# Patient Record
Sex: Male | Born: 1980 | Race: White | Hispanic: No | Marital: Married | Smoking: Current every day smoker
Health system: Southern US, Community
[De-identification: ages and names within clinical notes are randomized; demographics above are authoritative.]

## PROBLEM LIST (undated history)

## (undated) DIAGNOSIS — J939 Pneumothorax, unspecified: Secondary | ICD-10-CM

## (undated) DIAGNOSIS — N949 Unspecified condition associated with female genital organs and menstrual cycle: Secondary | ICD-10-CM

## (undated) HISTORY — DX: Pneumothorax, unspecified: J93.9

## (undated) HISTORY — PX: OTHER SURGICAL HISTORY: SHX169

---

## 2002-12-20 ENCOUNTER — Encounter: Payer: Self-pay | Admitting: Emergency Medicine

## 2002-12-20 ENCOUNTER — Inpatient Hospital Stay (HOSPITAL_COMMUNITY): Admission: EM | Admit: 2002-12-20 | Discharge: 2002-12-23 | Payer: Self-pay | Admitting: Emergency Medicine

## 2002-12-20 ENCOUNTER — Encounter: Payer: Self-pay | Admitting: Orthopedic Surgery

## 2003-02-11 ENCOUNTER — Encounter: Admission: RE | Admit: 2003-02-11 | Discharge: 2003-05-12 | Payer: Self-pay | Admitting: Orthopedic Surgery

## 2003-02-16 ENCOUNTER — Inpatient Hospital Stay (HOSPITAL_COMMUNITY): Admission: EM | Admit: 2003-02-16 | Discharge: 2003-02-19 | Payer: Self-pay | Admitting: Emergency Medicine

## 2003-02-16 ENCOUNTER — Encounter: Payer: Self-pay | Admitting: Emergency Medicine

## 2003-02-17 ENCOUNTER — Encounter: Payer: Self-pay | Admitting: Cardiothoracic Surgery

## 2003-02-18 ENCOUNTER — Encounter: Payer: Self-pay | Admitting: Cardiothoracic Surgery

## 2003-02-19 ENCOUNTER — Encounter: Payer: Self-pay | Admitting: Cardiothoracic Surgery

## 2003-02-28 ENCOUNTER — Encounter: Payer: Self-pay | Admitting: Cardiothoracic Surgery

## 2003-02-28 ENCOUNTER — Encounter: Admission: RE | Admit: 2003-02-28 | Discharge: 2003-02-28 | Payer: Self-pay | Admitting: Cardiothoracic Surgery

## 2003-07-24 ENCOUNTER — Ambulatory Visit (HOSPITAL_COMMUNITY): Admission: RE | Admit: 2003-07-24 | Discharge: 2003-07-24 | Payer: Self-pay | Admitting: Orthopedic Surgery

## 2005-12-23 ENCOUNTER — Encounter: Admission: RE | Admit: 2005-12-23 | Discharge: 2005-12-23 | Payer: Self-pay | Admitting: Orthopedic Surgery

## 2006-03-31 ENCOUNTER — Ambulatory Visit (HOSPITAL_COMMUNITY): Admission: RE | Admit: 2006-03-31 | Discharge: 2006-03-31 | Payer: Self-pay | Admitting: Orthopedic Surgery

## 2012-01-26 ENCOUNTER — Ambulatory Visit (INDEPENDENT_AMBULATORY_CARE_PROVIDER_SITE_OTHER): Payer: BC Managed Care – PPO | Admitting: Emergency Medicine

## 2012-01-26 VITALS — BP 98/76 | HR 74 | Temp 98.2°F | Resp 16 | Ht 64.25 in | Wt 161.8 lb

## 2012-01-26 DIAGNOSIS — S0500XA Injury of conjunctiva and corneal abrasion without foreign body, unspecified eye, initial encounter: Secondary | ICD-10-CM

## 2012-01-26 DIAGNOSIS — S058X9A Other injuries of unspecified eye and orbit, initial encounter: Secondary | ICD-10-CM

## 2012-01-26 MED ORDER — TOBRAMYCIN 0.3 % OP SOLN: 1.0000 [drp] | OPHTHALMIC | Status: AC

## 2012-01-26 NOTE — Progress Notes (Signed)
   Date:  01/26/2012   Name:  Brian Moon   DOB:  1981/03/09   MRN:  [id number]  PCP:  No primary provider on file.    Chief Complaint: Eye Problem   History of Present Illness:  Brian Moon is a 31 y.o. very pleasant male patient who presents with the following:  Hit in right eye yesterday by a small piece of low velocity plastic that caused him pain over night and has improved with use of otc eye drops.  No FB sensation.  No visual symptoms.  Denies other complaints  There is no problem list on file for this patient.   No past medical history on file.  No past surgical history on file.  History  Substance Use Topics  . Smoking status: Current Everyday Smoker  . Smokeless tobacco: Not on file  . Alcohol Use: Not on file    No family history on file.  No Known Allergies  Medication list has been reviewed and updated.  No current outpatient prescriptions on file prior to visit.    Review of Systems:  As per HPI, otherwise negative.    Physical Examination: Filed Vitals:   01/26/12 1533  BP: 98/76  Pulse: 74  Temp: 98.2 F (36.8 C)  Resp: 16   Filed Vitals:   01/26/12 1533  Height: 5' 4.25" (1.632 m)  Weight: 161 lb 12.8 oz (73.392 kg)   Body mass index is 27.56 kg/(m^2). Ideal Body Weight: Weight in (lb) to have BMI = 25: 146.5    GEN: WDWN, NAD, Non-toxic, Alert & Oriented x 3 HEENT: Atraumatic, Normocephalic.  Ears and Nose: No external deformity. EXTR: No clubbing/cyanosis/edema NEURO: Normal gait.  PSYCH: Normally interactive. Conversant. Not depressed or anxious appearing.  Calm demeanor.  EYE:  PRRERLA EOMI no FB.  No corneal uptake of fluorescein.  Injected right conjunctiva  Assessment and Plan: Corneal abrasion tobrex  Carmelina Dane, MD

## 2012-01-26 NOTE — Progress Notes (Deleted)
  Subjective:    Patient ID: Brian Moon, male    DOB: 09/08/1980, 30 y.o.   MRN: [id number]  HPI    Review of Systems     Objective:   Physical Exam        Assessment & Plan:   

## 2012-01-26 NOTE — Progress Notes (Deleted)
  Subjective:    Patient ID: Brian Moon, male    DOB: [birth year], 31 y.o.   MRN: [id number]  HPI    Review of Systems     Objective:   Physical Exam        Assessment & Plan:

## 2012-01-26 NOTE — Patient Instructions (Addendum)
Corneal Abrasion The cornea is the clear covering at the front and center of the eye. When looking at the colored portion (iris) of the eye, you are looking through that person's cornea.  This very thin tissue is made up of many layers. The surface layer is a single layer of cells called the corneal epithelium. This is one of the most sensitive tissues in the body. If a scratch or injury causes the corneal epithelium to come off, it is called a corneal abrasion. If the injury extends to the tissues below the epithelium, the condition is called a corneal ulcer.  CAUSES   Scratches.   Trauma.   Foreign body in the eye.   Some people have recurrences of abrasions in the area of the original injury even after they heal. This is called recurrent erosion syndrome. Recurrent erosion syndromes generally improve and go away with time.  SYMPTOMS   Eye pain.   Difficulty or inability to keep the injured eye open.   The eye becomes very sensitive to light.   Recurrent erosions tend to happen suddenly, first thing in the morning - usually upon awakening and opening the eyes.  DIAGNOSIS  Your eye professional can diagnose a corneal abrasion during an eye exam. Dye is usually placed in the eye using a drop or a small paper strip moistened by the patient's tears. When the eye is examined with a special light, the abrasion shows up clearly because of the dye. TREATMENT   Small abrasions may be treated with antibiotic drops or ointment alone.   Usually a pressure patch is specially applied. Pressure patches prevent the eye from blinking, allowing the corneal epithelium to heal. Because blinking is less, a pressure patch also reduces the amount of pain present in the eye during healing. Most corneal abrasions heal within 2-3 days with no effect on vision. WARNING: Do not drive or operate machinery while your eye is patched. Your ability to judge distances is impaired.   If abrasion becomes infected and  spreads to the deeper tissues of the cornea, a corneal ulcer can result. This is serious because it can cause corneal scarring. Corneal scars interfere with light passing through the cornea, and cause a loss of vision in the involved eye.   If your caregiver has given you a follow-up appointment, it is very important to keep that appointment. Not keeping the appointment could result in a severe eye infection or permanent loss of vision. If there is any problem keeping the appointment, you must call back to this facility for assistance.  SEEK MEDICAL CARE IF:   You have pain, light sensitivity and a scratchy feeling in one eye (or both).   Your pressure patch keeps loosening up and you can blink your eye under the patch after treatment.   Any kind of discharge develops from the involved eye after treatment or if the lids stick together in the morning.   You have the same symptoms in the morning as you did with the original abrasion days, weeks or months after the abrasion healed.  MAKE SURE YOU:   Understand these instructions.   Will watch your condition.   Will get help right away if you are not doing well or get worse.  Document Released: 06/24/2000 Document Revised: 06/16/2011 Document Reviewed: 01/31/2008 ExitCare Patient Information 2012 [employer]. 

## 2012-06-19 ENCOUNTER — Ambulatory Visit (INDEPENDENT_AMBULATORY_CARE_PROVIDER_SITE_OTHER): Payer: BC Managed Care – PPO | Admitting: Family Medicine

## 2012-06-19 VITALS — BP 112/80 | HR 82 | Temp 98.2°F | Resp 16 | Ht 66.0 in | Wt 168.0 lb

## 2012-06-19 DIAGNOSIS — H00019 Hordeolum externum unspecified eye, unspecified eyelid: Secondary | ICD-10-CM

## 2012-06-19 MED ORDER — DOXYCYCLINE HYCLATE 100 MG PO TABS: 100.0000 mg | ORAL_TABLET | Freq: Two times a day (BID) | ORAL | Status: DC

## 2012-06-19 MED ORDER — TOBRAMYCIN 0.3 % OP SOLN: 1.0000 [drp] | Freq: Four times a day (QID) | OPHTHALMIC | Status: DC

## 2012-06-19 NOTE — Progress Notes (Signed)
31 yo with swollen lateral upper left lid for 1 day.  He has applied ice.  No trauma   Objective:  nad Swollen tender left lateral upper lid.  No conjunctival injection, eom intact, pupils equal  Assessment: stye OS

## 2012-06-19 NOTE — Patient Instructions (Signed)
Sty  A sty (hordeolum) is an infection of a gland in the eyelid located at the base of the eyelash. A sty may develop a white or yellow head of pus. It can be puffy (swollen). Usually, the sty will burst and pus will come out on its own. They do not leave lumps in the eyelid once they drain.  A sty is often confused with another form of cyst of the eyelid called a chalazion. Chalazions occur within the eyelid and not on the edge where the bases of the eyelashes are. They often are red, sore and then form firm lumps in the eyelid.  CAUSES    Germs (bacteria).   Lasting (chronic) eyelid inflammation.  SYMPTOMS    Tenderness, redness and swelling along the edge of the eyelid at the base of the eyelashes.   Sometimes, there is a white or yellow head of pus. It may or may not drain.  DIAGNOSIS   An ophthalmologist will be able to distinguish between a sty and a chalazion and treat the condition appropriately.   TREATMENT    Styes are typically treated with warm packs (compresses) until drainage occurs.   In rare cases, medicines that kill germs (antibiotics) may be prescribed. These antibiotics may be in the form of drops, cream or pills.   If a hard lump has formed, it is generally necessary to do a small incision and remove the hardened contents of the cyst in a minor surgical procedure done in the office.   In suspicious cases, your caregiver may send the contents of the cyst to the lab to be certain that it is not a rare, but dangerous form of cancer of the glands of the eyelid.  HOME CARE INSTRUCTIONS    Wash your hands often and dry them with a clean towel. Avoid touching your eyelid. This may spread the infection to other parts of the eye.   Apply heat to your eyelid for 10 to 20 minutes, several times a day, to ease pain and help to heal it faster.   Do not squeeze the sty. Allow it to drain on its own. Wash your eyelid carefully 3 to 4 times per day to remove any pus.  SEEK IMMEDIATE MEDICAL CARE IF:     Your eye becomes painful or puffy (swollen).   Your vision changes.   Your sty does not drain by itself within 3 days.   Your sty comes back within a short period of time, even with treatment.   You have redness (inflammation) around the eye.   You have a fever.  Document Released: 04/06/2005 Document Revised: 09/19/2011 Document Reviewed: 12/09/2008  ExitCare Patient Information 2013 [employer].

## 2013-05-25 ENCOUNTER — Ambulatory Visit (INDEPENDENT_AMBULATORY_CARE_PROVIDER_SITE_OTHER): Payer: BC Managed Care – PPO | Admitting: Family Medicine

## 2013-05-25 ENCOUNTER — Ambulatory Visit: Payer: BC Managed Care – PPO

## 2013-05-25 VITALS — BP 128/70 | HR 81 | Temp 98.7°F | Resp 12 | Ht 65.0 in | Wt 161.0 lb

## 2013-05-25 DIAGNOSIS — M25532 Pain in left wrist: Secondary | ICD-10-CM

## 2013-05-25 DIAGNOSIS — M25539 Pain in unspecified wrist: Secondary | ICD-10-CM

## 2013-05-25 MED ORDER — PREDNISONE 20 MG PO TABS: ORAL_TABLET | ORAL | Status: DC

## 2013-05-25 NOTE — Patient Instructions (Signed)
Sprain  A sprain is a tear in one of the strong, fibrous tissues that connect your bones (ligaments). The severity of the sprain depends on how much of the ligament is torn. The tear can be either partial or complete.  CAUSES   Often, sprains are a result of a fall or an injury. The force of the impact causes the fibers of your ligament to stretch beyond their normal length. This excess tension causes the fibers of your ligament to tear.  SYMPTOMS   You may have some loss of motion or increased pain within your normal range of motion. Other symptoms include:  · Bruising.  · Tenderness.  · Swelling.  DIAGNOSIS   In order to diagnose a sprain, your caregiver will physically examine you to determine how torn the ligament is. Your caregiver may also suggest an X-ray exam to make sure no bones are broken.  TREATMENT   If your ligament is only partially torn, treatment usually involves keeping the injured area in a fixed position (immobilization) for a short period. To do this, your caregiver will apply a bandage, cast, or splint to keep the area from moving until it heals. For a partially torn ligament, the healing process usually takes 2 to 3 weeks.  If your ligament is completely torn, you may need surgery to reconnect the ligament to the bone or to reconstruct the ligament. After surgery, a cast or splint may be applied and will need to stay on for 4 to 6 weeks while your ligament heals.  HOME CARE INSTRUCTIONS  · Keep the injured area elevated to decrease swelling.  · To ease pain and swelling, apply ice to your joint twice a day, for 2 to 3 days.  · Put ice in a plastic bag.  · Place a towel between your skin and the bag.  · Leave the ice on for 15 minutes.  · Only take over-the-counter or prescription medicine for pain as directed by your caregiver.  · Do not leave the injured area unprotected until pain and stiffness go away (usually 3 to 4 weeks).  · Do not allow your cast or splint to get wet. Cover your cast or  splint with a plastic bag when you shower or bathe. Do not swim.  · Your caregiver may suggest exercises for you to do during your recovery to prevent or limit permanent stiffness.  SEEK IMMEDIATE MEDICAL CARE IF:  · Your cast or splint becomes damaged.  · Your pain becomes worse.  MAKE SURE YOU:  · Understand these instructions.  · Will watch your condition.  · Will get help right away if you are not doing well or get worse.  Document Released: 06/24/2000 Document Revised: 09/19/2011 Document Reviewed: 07/09/2011  ExitCare® Patient Information ©2014 [employer].

## 2013-05-25 NOTE — Progress Notes (Signed)
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 This began 2 weeks ago when he was working. Says the pain is on the ulnar side of his wrist.  The pain is worse when he is applying pressure to his wrist or putting it in extreme positions.  Objective: No acute distress Examination of the wrist reveals no swelling, normal bony contour, no ecchymosis. Patient has slight tenderness over the ulnar styloid. He has full range of motion but does experience some pain in extreme ulnar and radial deviation.   [facility] reading (PRIMARY) by  Dr. Milus Glazier  Left wrist: .negative  Assessment: Wrist sprain  Wrist pain, left - Plan: DG Wrist Complete Left, predniSONE (DELTASONE) 20 MG tablet Wrist splint Signed, Elvina Sidle, MD

## 2013-07-06 ENCOUNTER — Ambulatory Visit (INDEPENDENT_AMBULATORY_CARE_PROVIDER_SITE_OTHER): Payer: BC Managed Care – PPO | Admitting: Emergency Medicine

## 2013-07-06 ENCOUNTER — Ambulatory Visit: Payer: BC Managed Care – PPO

## 2013-07-06 VITALS — BP 144/88 | HR 85 | Temp 97.8°F | Resp 16 | Ht 64.5 in | Wt 161.2 lb

## 2013-07-06 DIAGNOSIS — M25532 Pain in left wrist: Secondary | ICD-10-CM

## 2013-07-06 DIAGNOSIS — M25539 Pain in unspecified wrist: Secondary | ICD-10-CM

## 2013-07-06 NOTE — Progress Notes (Addendum)
Subjective:   Patient ID: Brian Moon, male    DOB: Aug 13, 1980, 32 y.o.   MRN: [id number]  HPI  This chart was scribed for Collene Gobble, MD, by Ellin Mayhew, ED Scribe. This patient was seen in room 1 and the patient's care was started at 8:07 AM.  HPI Comments: Brian Moon is a 32 y.o. male who presents to the [facility] complaining of L wrist pain which is made worse by movement and holding weight. Patient reports the pain began 5 weeks ago; however, he does not recall any related trauma. He was seen by Dr. Milus Glazier at that time and was advised it was sprained. He was then placed in a splint and encouraged to rest his hand. He reports initially having some mild relief; however, he returns today with minimal overall improvement of his symptoms. Patient is an otherwise healthy male who does not currently take medication or have any chronic medical condition. He has taken aleve in the past. He currently works as a Biochemist, clinical; however, he does not recall any work-related injury.   History reviewed. No pertinent past medical history.  History reviewed. No pertinent past surgical history.  History reviewed. No pertinent family history.  History   Social History  . Marital Status: Single    Spouse Name: N/A    Number of Children: N/A  . Years of Education: N/A   Occupational History  . Not on file.   Social History Main Topics  . Smoking status: Current Every Day Smoker  . Smokeless tobacco: Not on file  . Alcohol Use: Yes  . Drug Use: No  . Sexual Activity: Not on file   Other Topics Concern  . Not on file   Social History Narrative  . No narrative on file    No Known Allergies  There are no active problems to display for this patient.   No results found for this or any previous visit.  No diagnosis found.  No orders of the defined types were placed in this encounter.    BP 144/88  Pulse 85  Temp(Src) 97.8 F (36.6 C) (Oral)   Resp 16  Ht 5' 4.5" (1.638 m)  Wt 161 lb 3.2 oz (73.12 kg)  BMI 27.25 kg/m2  SpO2 98%  Review of Systems  Constitutional: Negative for fever, chills, activity change and appetite change.  HENT: Negative for congestion and ear pain.   Respiratory: Negative for shortness of breath.   Cardiovascular: Negative for chest pain and leg swelling.  Gastrointestinal: Negative for nausea, vomiting and diarrhea.  Musculoskeletal:       L wrist pain  Neurological: Negative for weakness.  A complete 10 system review of systems was obtained and all systems are negative except as noted in the HPI and PMH.   Objective:  Physical Exam  CONSTITUTIONAL: Well developed/well nourished HEAD: Normocephalic/atraumatic EYES: EOMI/PERRL ENMT: Mucous membranes moist NECK: supple no meningeal signs SPINE:entire spine nontender CV: S1/S2 noted, no murmurs/rubs/gallops noted LUNGS: Lungs are clear to auscultation bilaterally, no apparent distress ABDOMEN: soft, nontender, no rebound or guarding MUSC: Tenderness proximal to L aulnar styloid. Moderate swelling to the distal L aulnar. GU:no cva tenderness NEURO: Pt is awake/alert, moves all extremitiesx4 EXTREMITIES: pulses normal, full ROM SKIN: warm, color normal PSYCH: no abnormalities of mood noted [facility] reading (PRIMARY) by  Dr.Delmas Faucett no fracture seen   Assessment & Plan:  I personally performed the services described in this documentation, which was scribed in  my presence. The recorded information has been reviewed and is accurate. He is placed on light duty he has a splint to use advised him to take Aleve one to 2 twice a day referral made to Dr.Gramig.

## 2013-07-06 NOTE — Progress Notes (Deleted)
   Subjective:    Patient ID: Brian Moon, male    DOB: Dec 23, 1980, 32 y.o.   MRN: [id number]  HPI    Review of Systems     Objective:   Physical Exam        Assessment & Plan:

## 2014-07-28 ENCOUNTER — Ambulatory Visit (INDEPENDENT_AMBULATORY_CARE_PROVIDER_SITE_OTHER): Payer: PRIVATE HEALTH INSURANCE | Admitting: Emergency Medicine

## 2014-07-28 ENCOUNTER — Telehealth: Payer: Self-pay | Admitting: Physician Assistant

## 2014-07-28 VITALS — BP 132/86 | HR 81 | Temp 98.3°F | Resp 18 | Ht 64.75 in | Wt 171.6 lb

## 2014-07-28 DIAGNOSIS — L42 Pityriasis rosea: Secondary | ICD-10-CM

## 2014-07-28 DIAGNOSIS — Z113 Encounter for screening for infections with a predominantly sexual mode of transmission: Secondary | ICD-10-CM

## 2014-07-28 DIAGNOSIS — R21 Rash and other nonspecific skin eruption: Secondary | ICD-10-CM

## 2014-07-28 LAB — RPR

## 2014-07-28 LAB — POCT SKIN KOH: SKIN KOH, POC: NEGATIVE

## 2014-07-28 NOTE — Telephone Encounter (Signed)
Spoke directly with patient of lab results.  This was an expected negative result for syphilis.  He will await resolve in 2 months, and if no improvement, will rtc or contact for an early referral back to his dermatologist.

## 2014-07-28 NOTE — Patient Instructions (Signed)
Pityriasis Rosea  Pityriasis rosea is a rash which is probably caused by a virus. It generally starts as a scaly, red patch on the trunk (the area of the body that a t-shirt would cover) but does not appear on sun exposed areas. The rash is usually preceded by an initial larger spot called the "herald patch" a week or more before the rest of the rash appears. Generally within one to two days the rash appears rapidly on the trunk, upper arms, and sometimes the upper legs. The rash usually appears as flat, oval patches of scaly pink color. The rash can also be raised and one is able to feel it with a finger. The rash can also be finely crinkled and may slough off leaving a ring of scale around the spot. Sometimes a mild sore throat is present with the rash. It usually affects children and young adults in the spring and autumn. Women are more frequently affected than men.  TREATMENT   Pityriasis rosea is a self-limited condition. This means it goes away within 4 to 8 weeks without treatment. The spots may persist for several months, especially in darker-colored skin after the rash has resolved and healed. Benadryl and steroid creams may be used if itching is a problem.  SEEK MEDICAL CARE IF:   · Your rash does not go away or persists longer than three months.  · You develop fever and joint pain.  · You develop severe headache and confusion.  · You develop breathing difficulty, vomiting and/or extreme weakness.  Document Released: 08/03/2001 Document Revised: 09/19/2011 Document Reviewed: 08/22/2008  ExitCare® Patient Information ©2015 [employer]. This information is not intended to replace advice given to you by your health care provider. Make sure you discuss any questions you have with your health care provider.

## 2014-07-28 NOTE — Progress Notes (Signed)
    MRN: [id number] DOB: [birth year]  Subjective:   Chief Complaint  Patient presents with  . Rash    Started on R side of chest, spreading over the last week     Lenard ForthBenjamin E Gavitt is a 34 y.o. male with PMH of squamous cell carcinoma presenting for 1 week of rash.  Patient states that he noticed the rash at the right side of his chest and migrated to the left side and up his axillas.  He has also notices that the back of his legs have rash, but are different from the chest, though he noticed it at the same time.  He states that it is very mildly itches, and is not worsening at night.  Wife states that they switched laundry detergent, but switched, back with no improvement of rash.  Patient denies joint swelling or pain.  He denies fever, headaches, or swollen lymph nodes.   They have 1 dog.  Wife denies any itching.  He has no rash in web spacing or flexor areas.   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  He is followed by a dermatologist.  Next appointment is in 4 months.    Sharlet SalinaBenjamin currently has no medications in their medication list.  He has No Known Allergies.  Sharlet SalinaBenjamin  has a past medical history of Pneumothorax on right. Also  has no past surgical history on file.  ROS As in subjective.  Objective:   Vitals: BP 132/86 mmHg  Pulse 81  Temp(Src) 98.3 F (36.8 C) (Oral)  Resp 18  Ht 5' 4.75" (1.645 m)  Wt 171 lb 9.6 oz (77.837 kg)  BMI 28.76 kg/m2  SpO2 100%  Physical Exam  Constitutional: He is oriented to person, place, and time and well-developed, well-nourished, and in no distress. Vital signs are normal. No distress.  HENT:  Head: Normocephalic and atraumatic.  Eyes: Conjunctivae are normal. Pupils are equal, round, and reactive to light.  Neck: Normal range of motion. Neck supple.  Cardiovascular: Normal rate.   Pulmonary/Chest: Effort normal and breath sounds normal. No respiratory distress.  He has no wheezes.  Neurological: He is alert and oriented to person, place, and time.  Skin: Skin is warm. Rash (Erythematous macular papular along trunk.  They are diffusely placed along trunk and spread more sparse along the extremities. ) noted.  Palm and sole sparing.   The  posterior mid-thigh has erythematous raised patches with some mildy hyperpigmented plaques in the backdrop bilaterally.    Psychiatric: Mood and affect normal.   Results for orders placed or performed in visit on 07/28/14  POCT Skin KOH  Result Value Ref Range   Skin KOH, POC Negative     Assessment and Plan :  34 year old male is here today for 1 week of rash. I most suspicious that this pityriasis rosea due to it's non-pruritic characteristics and originating location.  Will confirm that this is not of syphilis etiology.  Pityriasis Rosea -6-8 weeks, will follow up if sxs worsen or do not improve/resolve at this time.  May consider direct early referral to dermatologist.    Screening for STD (sexually transmitted disease) - Plan: RPR  Rash and nonspecific skin eruption - Plan: POCT Skin KOH, RPR   Trena PlattStephanie English, PA-C [facility] 1/18/20162:19 PM

## 2014-09-09 ENCOUNTER — Emergency Department (INDEPENDENT_AMBULATORY_CARE_PROVIDER_SITE_OTHER)
Admission: EM | Admit: 2014-09-09 | Discharge: 2014-09-09 | Disposition: A | Discharge status: Home / Self Care | Payer: PRIVATE HEALTH INSURANCE | Source: Home / Self Care | Attending: Emergency Medicine | Admitting: Emergency Medicine

## 2014-09-09 ENCOUNTER — Encounter: Payer: Self-pay | Admitting: Emergency Medicine

## 2014-09-09 DIAGNOSIS — J1189 Influenza due to unidentified influenza virus with other manifestations: Secondary | ICD-10-CM | POA: Diagnosis not present

## 2014-09-09 DIAGNOSIS — J111 Influenza due to unidentified influenza virus with other respiratory manifestations: Secondary | ICD-10-CM

## 2014-09-09 DIAGNOSIS — R52 Pain, unspecified: Secondary | ICD-10-CM

## 2014-09-09 LAB — POCT INFLUENZA A/B
INFLUENZA A, POC: POSITIVE
INFLUENZA B, POC: NEGATIVE

## 2014-09-09 NOTE — ED Notes (Signed)
Fever, chills, fatigue, dry cough, sneezing, body aches, x 3 days, red rash around neck last night

## 2014-09-09 NOTE — ED Provider Notes (Signed)
CSN: [id number]     Arrival date & time 09/09/14  1217 History   First MD Initiated Contact with Patient 09/09/14 1253     Chief Complaint  Patient presents with  . Fever   (Consider location/radiation/quality/duration/timing/severity/associated sxs/prior Treatment) Patient is a 34 y.o. male presenting with fever. No language interpreter was used.  Fever Temp source:  Subjective Severity:  Moderate Onset quality:  Gradual Duration:  3 days Timing:  Constant Progression:  Worsening Chronicity:  New Relieved by:  Nothing Worsened by:  Nothing tried Ineffective treatments:  None tried Associated symptoms: cough   Risk factors: no sick contacts     Past Medical History  Diagnosis Date  . Pneumothorax on right    Past Surgical History  Procedure Laterality Date  . Left foot     History reviewed. No pertinent family history. History  Substance Use Topics  . Smoking status: Current Every Day Smoker -- 0.50 packs/day for 15 years    Types: Cigarettes  . Smokeless tobacco: Not on file  . Alcohol Use: 0.0 oz/week    0 Standard drinks or equivalent per week    Review of Systems  Constitutional: Positive for fever.  Respiratory: Positive for cough.   All other systems reviewed and are negative.   Allergies  Review of patient's allergies indicates not on file.  Home Medications   Prior to Admission medications   Medication Sig Start Date End Date Taking? Authorizing Provider  acetaminophen (TYLENOL) 325 MG tablet Take 650 mg by mouth every 6 (six) hours as needed.   Yes Historical Provider, MD  ibuprofen (ADVIL,MOTRIN) 200 MG tablet Take 200 mg by mouth every 6 (six) hours as needed.   Yes Historical Provider, MD   BP 131/84 mmHg  Pulse 89  Temp(Src) 98.1 F (36.7 C) (Oral)  Ht 5\' 5"  (1.651 m)  Wt 172 lb (78.019 kg)  BMI 28.62 kg/m2  SpO2 99% Physical Exam  Constitutional: He is oriented to person, place, and time. He appears well-developed and well-nourished.   HENT:  Head: Normocephalic.  Right Ear: External ear normal.  Left Ear: External ear normal.  Nose: Nose normal.  Mouth/Throat: Oropharynx is clear and moist.  Eyes: Conjunctivae and EOM are normal. Pupils are equal, round, and reactive to light.  Neck: Normal range of motion.  Cardiovascular: Normal rate and normal heart sounds.   Pulmonary/Chest: Effort normal.  Abdominal: Soft. He exhibits no distension.  Musculoskeletal: Normal range of motion.  Neurological: He is alert and oriented to person, place, and time.  Skin: Skin is warm.  Psychiatric: He has a normal mood and affect.  Nursing note and vitals reviewed.   ED Course  Procedures (including critical care time) Labs Review Labs Reviewed  POCT INFLUENZA A/B    Imaging Review No results found.   MDM   1. Body aches   2. Influenza with respiratory manifestations        Elson AreasLeslie K Latori Beggs, PA-C 09/09/14 1539

## 2014-09-09 NOTE — Discharge Instructions (Signed)

## 2015-07-09 ENCOUNTER — Encounter: Payer: Self-pay | Admitting: *Deleted

## 2015-07-09 ENCOUNTER — Emergency Department (INDEPENDENT_AMBULATORY_CARE_PROVIDER_SITE_OTHER)
Admission: EM | Admit: 2015-07-09 | Discharge: 2015-07-09 | Disposition: A | Discharge status: Home / Self Care | Payer: PRIVATE HEALTH INSURANCE | Source: Home / Self Care | Attending: Family Medicine | Admitting: Family Medicine

## 2015-07-09 DIAGNOSIS — J069 Acute upper respiratory infection, unspecified: Secondary | ICD-10-CM

## 2015-07-09 LAB — POCT RAPID STREP A (OFFICE): Rapid Strep A Screen: NEGATIVE

## 2015-07-09 MED ORDER — AZITHROMYCIN 250 MG PO TABS: ORAL_TABLET | ORAL | Status: DC

## 2015-07-09 NOTE — ED Provider Notes (Signed)
CSN: [id number]     Arrival date & time 07/09/15  1740 History   First MD Initiated Contact with Patient 07/09/15 1851     Chief Complaint  Patient presents with  . Sore Throat      HPI Comments: Patient complains of onset of bilateral earache about 5 days ago.  During the past 2 days he has become fatigued and developed a sore throat.  The history is provided by the patient.    Past Medical History  Diagnosis Date  . Pneumothorax on right    Past Surgical History  Procedure Laterality Date  . Left foot     History reviewed. No pertinent family history. Social History  Substance Use Topics  . Smoking status: Current Every Day Smoker -- 0.50 packs/day for 15 years    Types: Cigarettes  . Smokeless tobacco: None  . Alcohol Use: 0.0 oz/week    0 Standard drinks or equivalent per week    Review of Systems + sore throat No cough No pleuritic pain No wheezing + nasal congestion + post-nasal drainage No sinus pain/pressure No itchy/red eyes + earache No hemoptysis No SOB No fever/chills No nausea No vomiting No abdominal pain No diarrhea No urinary symptoms No skin rash + fatigue No myalgias No headache Used OTC meds without relief  Allergies  Review of patient's allergies indicates no known allergies.  Home Medications   Prior to Admission medications   Medication Sig Start Date End Date Taking? Authorizing Provider  acetaminophen (TYLENOL) 325 MG tablet Take 650 mg by mouth every 6 (six) hours as needed.    Historical Provider, MD  azithromycin (ZITHROMAX Z-PAK) 250 MG tablet Take 2 tabs today; then begin one tab once daily for 4 more days. (Rx void after 07/19/15) 07/09/15   Lattie Haw, MD  ibuprofen (ADVIL,MOTRIN) 200 MG tablet Take 200 mg by mouth every 6 (six) hours as needed.    Historical Provider, MD   Meds Ordered and Administered this Visit  Medications - No data to display  BP 139/86 mmHg  Pulse 88  Temp(Src) 98.3 F (36.8 C) (Oral)   Resp 16  Wt 172 lb (78.019 kg)  SpO2 98% No data found.   Physical Exam Nursing notes and Vital Signs reviewed. Appearance:  Patient appears stated age, and in no acute distress Eyes:  Pupils are equal, round, and reactive to light and accomodation.  Extraocular movement is intact.  Conjunctivae are not inflamed  Ears:  Canals normal.  Tympanic membranes normal.  Nose:  Mildly congested turbinates.  No sinus tenderness.   Pharynx:  Uvula slightly swollen  Neck:  Supple.  Tender enlarged posterior nodes are palpated bilaterally  Lungs:  Clear to auscultation.  Breath sounds are equal.  Moving air well. Heart:  Regular rate and rhythm without murmurs, rubs, or gallops.  Abdomen:  Nontender without masses or hepatosplenomegaly.  Bowel sounds are present.  No CVA or flank tenderness.  Extremities:  No edema.  No calf tenderness Skin:  No rash present.   ED Course  Procedures  None    Labs Reviewed  POCT RAPID STREP A (OFFICE) negative     MDM   1. Viral URI    There is no evidence of bacterial infection today.  Treat symptomatically for now  Take plain guaifenesin (  extended release tabs such as Mucinex) twice daily, with plenty of water, for cough and congestion.  May add Pseudoephedrine ( , one or two every 4 to 6 hours) for  sinus congestion.  Get adequate rest.   May use Afrin nasal spray (or generic oxymetazoline) twice daily for about 5 days and then discontinue.  Also recommend using saline nasal spray several times daily and saline nasal irrigation (AYR is a common brand).   Try warm salt water gargles for sore throat.  Stop all antihistamines for now, and other non-prescription cough/cold preparations. May take Ibuprofen 200mg , 4 tabs every 8 hours with food for sore throat. May take Delsym Cough Suppressant at bedtime for nighttime cough.  Begin Azithromycin if not improving about one week or if persistent fever develops (Given a prescription to hold, with an  expiration date)  Follow-up with family doctor if not improving about10 days.     Lattie HawStephen A Bearl Talarico, MD 07/16/15 2102

## 2015-07-09 NOTE — ED Notes (Signed)
Pt c/o sore throat and bilateral ear ache x 2 days. Denies fever.

## 2015-07-09 NOTE — Discharge Instructions (Signed)
Take plain guaifenesin (1200mg  extended release tabs such as Mucinex) twice daily, with plenty of water, for cough and congestion.  May add Pseudoephedrine (30mg , one or two every 4 to 6 hours) for sinus congestion.  Get adequate rest.   May use Afrin nasal spray (or generic oxymetazoline) twice daily for about 5 days and then discontinue.  Also recommend using saline nasal spray several times daily and saline nasal irrigation (AYR is a common brand).   Try warm salt water gargles for sore throat.  Stop all antihistamines for now, and other non-prescription cough/cold preparations. May take Ibuprofen 200mg , 4 tabs every 8 hours with food for sore throat. May take Delsym Cough Suppressant at bedtime for nighttime cough.  Begin Azithromycin if not improving about one week or if persistent fever develops  Follow-up with family doctor if not improving about10 days.

## 2015-12-20 ENCOUNTER — Emergency Department
Admission: EM | Admit: 2015-12-20 | Discharge: 2015-12-20 | Discharge status: Absent without leave | Payer: PRIVATE HEALTH INSURANCE | Source: Home / Self Care

## 2016-09-22 ENCOUNTER — Emergency Department (INDEPENDENT_AMBULATORY_CARE_PROVIDER_SITE_OTHER): Payer: Worker's Compensation

## 2016-09-22 ENCOUNTER — Emergency Department (INDEPENDENT_AMBULATORY_CARE_PROVIDER_SITE_OTHER)
Admission: EM | Admit: 2016-09-22 | Discharge: 2016-09-22 | Disposition: A | Discharge status: Home / Self Care | Payer: Worker's Compensation | Source: Home / Self Care

## 2016-09-22 DIAGNOSIS — S6710XA Crushing injury of unspecified finger(s), initial encounter: Secondary | ICD-10-CM | POA: Diagnosis not present

## 2016-09-22 DIAGNOSIS — S62660A Nondisplaced fracture of distal phalanx of right index finger, initial encounter for closed fracture: Secondary | ICD-10-CM | POA: Diagnosis not present

## 2016-09-22 DIAGNOSIS — W208XXA Other cause of strike by thrown, projected or falling object, initial encounter: Secondary | ICD-10-CM

## 2016-09-22 DIAGNOSIS — S6010XA Contusion of unspecified finger with damage to nail, initial encounter: Secondary | ICD-10-CM | POA: Diagnosis not present

## 2016-09-22 DIAGNOSIS — S62600A Fracture of unspecified phalanx of right index finger, initial encounter for closed fracture: Secondary | ICD-10-CM

## 2016-09-22 HISTORY — DX: Unspecified condition associated with female genital organs and menstrual cycle: N94.9

## 2016-09-22 MED ORDER — HYDROCODONE-ACETAMINOPHEN 5-325 MG PO TABS: 1.0000 | ORAL_TABLET | ORAL | 0 refills | Status: DC | PRN

## 2016-09-22 NOTE — Discharge Instructions (Signed)
Keep the finger clean and dry. May use soap and water to clean finger then redress daily. Keep the finger splint on. OTC ibuprofen, 600 mg every 6-8 hours with food as needed for moderate pain. Vicodin prescription if needed for severe pain. No work with right hand for now. Make appointment to follow up at [facility] Monday, [phone]. They will assist with referral to orthopedist within 1 week to reevaluate.

## 2016-09-22 NOTE — ED Triage Notes (Signed)
Urine drug screen 7 panal sent to [facility].

## 2016-09-22 NOTE — ED Triage Notes (Signed)
[facility]-  Pt was unloading a jack hammer off a van around 3:30 this afternoon, and it dropped on his right index finger.  Finger went numb, and in now very tender to the touch and still numb.  At time of the occurrence it was a pain level 10 as long as he is not moving it, it is a 3 at present time.

## 2016-09-23 NOTE — ED Provider Notes (Addendum)
[facility]    CSN: [id number] Arrival date & time: 09/22/16  1815     History   Chief Complaint Chief Complaint  Patient presents with  . Finger Injury  Work-Related Injury, while on-the-job today 3:30 pm 09/22/16  HPI Brian Moon is a 36 y.o. male.   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  R index finger very tender to the touch and slightly numb.   Pain level 10/10 with touching or moving finger, now 3/10 at rest. Pain is dull, throbbing, no radiation. Denies weakness, but has mild feeling of numbness R index finger. No open wound. No loss of consciousness or other neuro sxs. No headache, neck, back pain. Has not taken any pain med.   Past Medical History:  Diagnosis Date  . CMT (cervical motion tenderness)   . Pneumothorax on right     There are no active problems to display for this patient.   Past Surgical History:  Procedure Laterality Date  . Left foot    fx L foot, approx 2003.     Home Medications   None  Family History History reviewed. No pertinent family history.  Social History Social History  Substance Use Topics  . Smoking status: Current Every Day Smoker    Packs/day: 0.50    Years: 15.00    Types: Cigarettes  . Smokeless tobacco: Not on file  . Alcohol use 0.0 oz/week  Married. Wife is an Charity fundraiser   Allergies   Patient has no known allergies.   Review of Systems Review of Systems  Constitutional: Negative for fever.  Respiratory: Negative.   Cardiovascular: Negative.   Gastrointestinal: Negative.   Genitourinary: Negative.   Neurological: Negative for dizziness, seizures and headaches.  Psychiatric/Behavioral: Negative for confusion and hallucinations.  All other systems reviewed and are negative.    Physical Exam Triage Vital Signs ED Triage Vitals  Enc Vitals Group     BP 09/22/16 1846 133/84     Pulse Rate 09/22/16 1846 81       Resp --      Temp 09/22/16 1846 98 F (36.7 C)     Temp Source 09/22/16 1846 Oral     SpO2 09/22/16 1846 96 %     Weight 09/22/16 1847 172 lb (78 kg)     Height 09/22/16 1847 5\' 5"  (1.651 m)     Head Circumference --      Peak Flow --      Pain Score 09/22/16 1849 3     Pain Loc --      Pain Edu? --      Excl. in GC? --    No data found.   Updated Vital Signs BP 133/84 (BP Location: Left Arm)   Pulse 81   Temp 98 F (36.7 C) (Oral)   Ht 5\' 5"  (1.651 m)   Wt 172 lb (78 kg)   SpO2 96%   BMI 28.62 kg/m    Physical Exam  Constitutional: He is oriented to person, place, and time. He appears well-developed and well-nourished. He appears distressed (from R index finger pain).  No cardiorespiratory distress  HENT:  Head: Normocephalic and atraumatic.  Eyes: Pupils are equal, round, and reactive to light. No scleral icterus.  Neck: Normal range of motion. Neck supple.  Cardiovascular: Normal rate and regular rhythm.   Pulmonary/Chest: Effort normal.  Abdominal: He exhibits no distension.  Musculoskeletal:       Right wrist: Normal. He exhibits normal range of motion and no tenderness.       Hands: R 2nd finger injury as noted. ROM intact.  Remainder of fingers and R hand normal, nontender, without deformity.  Neurological: He is alert and oriented to person, place, and time. He has normal strength. No cranial nerve deficit or sensory deficit.  Skin: Skin is warm and dry. Capillary refill takes less than 2 seconds.  Psychiatric: He has a normal mood and affect. His behavior is normal.  Vitals reviewed.    UC Treatments / Results  Labs (all labs ordered are listed, but only abnormal results are displayed) Labs Reviewed - No data to display   Radiology Dg Hand Complete Right  Result Date: 09/22/2016 CLINICAL DATA:  Acute right hand pain following injury at work today. Initial encounter. EXAM: RIGHT HAND - COMPLETE 3+ VIEW COMPARISON:  None. FINDINGS: A tuft  fracture of the index finger is noted. No other fracture, subluxation or dislocation identified. The joint spaces are unremarkable. IMPRESSION: Tuft fracture of the index finger. Electronically Signed   By: Harmon PierJeffrey  Hu M.D.   On: 09/22/2016 19:07    Procedures .Marland Kitchen.Incision and Drainage Date/Time: 09/22/2016 7:28 PM Performed by: Georgina PillionMASSEY, Wanette Robison Authorized by: Georgina PillionMASSEY, Kortez Murtagh   Consent:    Consent obtained:  Verbal   Consent given by:  Patient   Risks discussed:  Bleeding, infection, incomplete drainage and pain   Alternatives discussed:  No treatment, delayed treatment, alternative treatment, observation and referral Location:    Indications for incision and drainage: subungual hematoma, R index finger. Pre-procedure details:    Skin preparation:  Betadine Anesthesia (see MAR for exact dosages):    Anesthesia method:  Nerve block   Block location:  Digital Block, R index finger   Block needle gauge:  27 G   Block anesthetic:  Lidocaine 2% w/o epi (6 ml)   Block injection procedure:  Anatomic landmarks identified, anatomic landmarks palpated, negative aspiration for blood, introduced needle and incremental injection   Block outcome:  Anesthesia achieved Procedure type:    Complexity:  Simple Procedure details:    Incision type: Electrocautery, 2x2 mm through mid-center nail.   Drainage amount: small amount blood. Hematoma successfully evacuated. Post-procedure details:    Patient tolerance of procedure:  Tolerated well, no immediate complications Comments:     2x2 applied, wrapped with gauze dressing.  Then, aluminum cage splint applied , and secured lightly with coban. Wound aftercare, precautions, and red flags discussed. Pt (and wife, who was present) voiced understanding.      Medications Ordered in UC Medications - No data to display   Initial Impression / Assessment and Plan / UC Course  I have reviewed the triage vital signs and the nursing notes.  Pertinent labs &  imaging results that were available during my care of the patient were reviewed by me and considered in my medical decision making (see chart for details).  Clinical Course as of Sep 24 1831  Thu Sep 22, 2016  1859 DG Hand Complete Right [DM]    Clinical Course User Index [DM] Lajean Manesavid Sirenity Shew, MD     XRAY R HAND   Result Date: 09/22/2016 CLINICAL DATA:  Acute right hand pain following injury at work today. Initial encounter. EXAM: RIGHT HAND - COMPLETE 3+ VIEW COMPARISON:  None. FINDINGS: A tuft fracture of the index finger is noted. No other fracture, subluxation or dislocation identified. The joint spaces are  unremarkable. IMPRESSION: Tuft fracture of the index finger. Electronically Signed   By: Harmon Pier M.D.   On: 09/22/2016 19:07   I reviewed the xray results with pt and wife.   Final Clinical Impressions(s) / UC Diagnoses   Final diagnoses:  Crushing injury of finger, initial encounter  Subungual hematoma of digit of hand, initial encounter  Closed nondisplaced fracture of distal phalanx of right index finger, initial encounter   See procedure details above.   New Prescriptions Discharge Medication List as of 09/22/2016  8:05 PM    START taking these medications   Details  HYDROcodone-acetaminophen (NORCO/VICODIN) 5-325 MG tablet Take 1-2 tablets by mouth every 4 (four) hours as needed for severe pain. Take with food., Starting Thu 09/22/2016, Print      Ibuprofen otc,take 3 (600 mg) every 6 hrs with food, prn moderate pain.  An After Visit Summary was printed and given to the patient and wife. Follow-up at [facility] in 4-5 days. (advised to call and make appt)---f/u sooner at [facility] or ED if any red flags. Keep R hand elevated and splinted.  Precautions discussed. Red flags discussed.  Work Status:  On 09/23/16, May do very light work only, May not use Right Hand at all, From 3/16- 09/28/2016.  Note Written.  Questions invited and answered. They voiced  understanding and agreement.      Lajean Manes, MD 09/23/16 Clarisa Fling    Lajean Manes, MD 09/23/16 [phone]

## 2017-08-22 ENCOUNTER — Emergency Department (INDEPENDENT_AMBULATORY_CARE_PROVIDER_SITE_OTHER): Payer: PRIVATE HEALTH INSURANCE

## 2017-08-22 ENCOUNTER — Emergency Department (INDEPENDENT_AMBULATORY_CARE_PROVIDER_SITE_OTHER)
Admission: EM | Admit: 2017-08-22 | Discharge: 2017-08-22 | Disposition: A | Discharge status: Home / Self Care | Payer: No Typology Code available for payment source | Source: Home / Self Care | Attending: Family Medicine | Admitting: Family Medicine

## 2017-08-22 ENCOUNTER — Other Ambulatory Visit: Payer: Self-pay

## 2017-08-22 DIAGNOSIS — S6992XA Unspecified injury of left wrist, hand and finger(s), initial encounter: Secondary | ICD-10-CM

## 2017-08-22 DIAGNOSIS — S63653A Sprain of metacarpophalangeal joint of left middle finger, initial encounter: Secondary | ICD-10-CM

## 2017-08-22 DIAGNOSIS — X58XXXA Exposure to other specified factors, initial encounter: Secondary | ICD-10-CM | POA: Diagnosis not present

## 2017-08-22 MED ORDER — MELOXICAM 15 MG PO TABS: 15.0000 mg | ORAL_TABLET | Freq: Every day | ORAL | 0 refills | Status: DC

## 2017-08-22 NOTE — Discharge Instructions (Signed)
Apply ice pack for 10 to 15 minutes, 2 to 3 times daily  Continue until pain and swelling decrease.  Begin range of motion exercises in about 3 to 4 days as tolerated.

## 2017-08-22 NOTE — ED Triage Notes (Signed)
Pt was working yesterday, and started having pain on the left hand, middle finger.  He is not sure if twisted it, or if it popped, but the pain was bad last night, and worse this am.  Pt has trouble gripping.

## 2017-08-22 NOTE — ED Provider Notes (Signed)
[facility]    CSN: [id number] Arrival date & time: 08/22/17  0809     History   Chief Complaint Chief Complaint  Patient presents with  . Hand Pain    HPI Brian Moon is a 37 y.o. male.   While working yesterday patient developed pain in his left third finger.  He recalls no injury.  The pain has persisted today.   The history is provided by the patient.  Hand Pain  This is a new problem. The current episode started yesterday. The problem occurs constantly. The problem has not changed since onset.Exacerbated by: grasping and flexion of finger. Nothing relieves the symptoms. He has tried nothing for the symptoms.    Past Medical History:  Diagnosis Date  . CMT (cervical motion tenderness)   . Pneumothorax on right     There are no active problems to display for this patient.   Past Surgical History:  Procedure Laterality Date  . Left foot         Home Medications    Prior to Admission medications   Medication Sig Start Date End Date Taking? Authorizing Provider  HYDROcodone-acetaminophen (NORCO/VICODIN) 5-325 MG tablet Take 1-2 tablets by mouth Moon 4 (four) hours as needed for severe pain. Take with food. 09/22/16   Lajean Manes, MD  meloxicam (MOBIC) 15 MG tablet Take 1 tablet (15 mg total) by mouth daily. Take with food. 08/22/17   Lattie Haw, MD    Family History History reviewed. No pertinent family history.  Social History Social History   Tobacco Use  . Smoking status: Current Moon Day Smoker    Packs/day: 0.50    Years: 15.00    Pack years: 7.50    Types: Cigarettes  Substance Use Topics  . Alcohol use: Yes    Alcohol/week: 0.0 oz  . Drug use: No     Allergies   Patient has no known allergies.   Review of Systems Review of Systems  All other systems reviewed and are negative.    Physical Exam Triage Vital Signs ED Triage Vitals  Enc Vitals Group     BP 08/22/17 0838 129/85     Pulse Rate 08/22/17  0838 77     Resp --      Temp 08/22/17 0838 98 F (36.7 C)     Temp Source 08/22/17 0838 Oral     SpO2 08/22/17 0838 98 %     Weight 08/22/17 0839 171 lb (77.6 kg)     Height 08/22/17 0839 5\' 5"  (1.651 m)     Head Circumference --      Peak Flow --      Pain Score 08/22/17 0839 0     Pain Loc --      Pain Edu? --      Excl. in GC? --    No data found.  Updated Vital Signs BP 129/85 (BP Location: Right Arm)   Pulse 77   Temp 98 F (36.7 C) (Oral)   Ht 5\' 5"  (1.651 m)   Wt 171 lb (77.6 kg)   SpO2 98%   BMI 28.46 kg/m   Visual Acuity Right Eye Distance:   Left Eye Distance:   Bilateral Distance:    Right Eye Near:   Left Eye Near:    Bilateral Near:     Physical Exam  Constitutional: He appears well-developed and well-nourished. No distress.  Cardiovascular: Normal rate.  Pulmonary/Chest: Effort normal.  Musculoskeletal:  Left hand: He exhibits tenderness and bony tenderness. He exhibits normal range of motion, normal two-point discrimination, normal capillary refill, no deformity, no laceration and no swelling. Normal sensation noted.       Hands: Tenderness left 3rd MCP joint and distal metacarpal.  Pain elicited during resisted flexion of 3rd finger and palpation of flexor tendons.  Neurological: He is alert.  Skin: Skin is warm and dry.  Nursing note and vitals reviewed.    UC Treatments / Results  Labs (all labs ordered are listed, but only abnormal results are displayed) Labs Reviewed - No data to display  EKG  EKG Interpretation None       Radiology Dg Hand Complete Left  Result Date: 08/22/2017 CLINICAL DATA:  Hand injury. EXAM: LEFT HAND - COMPLETE 3+ VIEW COMPARISON:  07/06/2013. FINDINGS: There is no evidence of fracture or dislocation. There is no evidence of arthropathy or other focal bone abnormality. Soft tissues are unremarkable. IMPRESSION: No acute bony or joint abnormality identified. No evidence of fracture or dislocation.  Electronically Signed   By: Maisie Fushomas  Register   On: 08/22/2017 09:11    Procedures Procedures (including critical care time)  Medications Ordered in UC Medications - No data to display   Initial Impression / Assessment and Plan / UC Course  I have reviewed the triage vital signs and the nursing notes.  Pertinent labs & imaging results that were available during my care of the patient were reviewed by me and considered in my medical decision making (see chart for details).    Rx for Mobic 15mg . Apply ice pack for 10 to 15 minutes, 2 to 3 times daily  Continue until pain and swelling decrease.  Begin range of motion exercises in about 3 to 4 days as tolerated. Followup with Dr. Rodney Langtonhomas Thekkekandam or Dr. Clementeen GrahamEvan [facility] ([facility] if not improving in one week.    Final Clinical Impressions(s) / UC Diagnoses   Final diagnoses:  Sprain of metacarpophalangeal (MCP) joint of left middle finger, initial encounter    ED Discharge Orders        Ordered    meloxicam (MOBIC) 15 MG tablet  Daily     08/22/17 0930          Lattie HawBeese, Stephen A, MD 09/02/17 1401

## 2017-09-22 ENCOUNTER — Emergency Department (INDEPENDENT_AMBULATORY_CARE_PROVIDER_SITE_OTHER)
Admission: EM | Admit: 2017-09-22 | Discharge: 2017-09-22 | Disposition: A | Discharge status: Home / Self Care | Payer: PRIVATE HEALTH INSURANCE | Source: Home / Self Care | Attending: Family Medicine | Admitting: Family Medicine

## 2017-09-22 ENCOUNTER — Encounter: Payer: Self-pay | Admitting: Emergency Medicine

## 2017-09-22 DIAGNOSIS — B09 Unspecified viral infection characterized by skin and mucous membrane lesions: Secondary | ICD-10-CM

## 2017-09-22 DIAGNOSIS — R112 Nausea with vomiting, unspecified: Secondary | ICD-10-CM | POA: Diagnosis not present

## 2017-09-22 DIAGNOSIS — R197 Diarrhea, unspecified: Secondary | ICD-10-CM | POA: Diagnosis not present

## 2017-09-22 NOTE — ED Triage Notes (Signed)
Pt c/o N+V on Wed, yesterday developed a rash on chest. States sxs have resolved today and he is feeling better.

## 2017-09-22 NOTE — Discharge Instructions (Signed)
Gradually advance to a regular diet.  Avoid milk products for several days.

## 2017-09-22 NOTE — ED Provider Notes (Signed)
[facility]    CSN: [id number] Arrival date & time: 09/22/17  1037     History   Chief Complaint No chief complaint on file.   HPI Brian Moon is a 37 y.o. male.   Patient reports that two days ago he developed diarrhea, followed by nausea/vomiting.  These symptoms improved, but last night he developed a rash on his feet and upper chest, and fever to 100.3.  He feels better today and notes that the rash is receding.  No abdominal pain, urinary symptoms, or respiratory symptoms.   The history is provided by the patient.    Past Medical History:  Diagnosis Date  . CMT (cervical motion tenderness)   . Pneumothorax on right     There are no active problems to display for this patient.   Past Surgical History:  Procedure Laterality Date  . Left foot         Home Medications    Prior to Admission medications   Medication Sig Start Date End Date Taking? Authorizing Provider  HYDROcodone-acetaminophen (NORCO/VICODIN) 5-325 MG tablet Take 1-2 tablets by mouth every 4 (four) hours as needed for severe pain. Take with food. 09/22/16   Lajean Manes, MD  meloxicam (MOBIC) 15 MG tablet Take 1 tablet (15 mg total) by mouth daily. Take with food. 08/22/17   Lattie Haw, MD    Family History History reviewed. No pertinent family history.  Social History Social History   Tobacco Use  . Smoking status: Current Every Day Smoker    Packs/day: 0.50    Years: 15.00    Pack years: 7.50    Types: Cigarettes  Substance Use Topics  . Alcohol use: Yes    Alcohol/week: 0.0 oz  . Drug use: No     Allergies   Patient has no known allergies.   Review of Systems Review of Systems No sore throat No cough No pleuritic pain No wheezing No nasal congestion No post-nasal drainage No sinus pain/pressure No itchy/red eyes No earache No hemoptysis No SOB + fever, + chills + nausea, resolved + vomiting, resolved No abdominal pain + diarrhea,  resolved No urinary symptoms + skin rash + fatigue No myalgias No headache    Physical Exam Triage Vital Signs ED Triage Vitals  Enc Vitals Group     BP 09/22/17 1133 117/82     Pulse Rate 09/22/17 1133 74     Resp --      Temp 09/22/17 1133 98.2 F (36.8 C)     Temp Source 09/22/17 1133 Oral     SpO2 09/22/17 1133 99 %     Weight 09/22/17 1134 166 lb (75.3 kg)     Height --      Head Circumference --      Peak Flow --      Pain Score 09/22/17 1134 0     Pain Loc --      Pain Edu? --      Excl. in GC? --    No data found.  Updated Vital Signs BP 117/82 (BP Location: Right Arm)   Pulse 74   Temp 98.2 F (36.8 C) (Oral)   Wt 166 lb (75.3 kg)   SpO2 99%   BMI 27.62 kg/m   Visual Acuity Right Eye Distance:   Left Eye Distance:   Bilateral Distance:    Right Eye Near:   Left Eye Near:    Bilateral Near:     Physical Exam  Constitutional:  He appears well-developed and well-nourished. No distress.  HENT:  Head: Normocephalic.  Right Ear: External ear normal.  Left Ear: External ear normal.  Nose: Nose normal.  Mouth/Throat: Mucous membranes are normal.  Eyes: Conjunctivae are normal. Pupils are equal, round, and reactive to light.  Neck: Neck supple.  Cardiovascular: Normal heart sounds.  Abdominal: Bowel sounds are normal. There is no tenderness.  Musculoskeletal: He exhibits no edema.  Lymphadenopathy:    He has no cervical adenopathy.  Neurological: He is alert.  Skin: Skin is warm and dry. Rash noted.     There is macular fading erythema of anterior neck and dorsa of distal feet as noted on diagram.  No tenderness to palpation or warmth.  Nursing note and vitals reviewed.    UC Treatments / Results  Labs (all labs ordered are listed, but only abnormal results are displayed) Labs Reviewed - No data to display  EKG  EKG Interpretation None       Radiology No results found.  Procedures Procedures (including critical care  time)  Medications Ordered in UC Medications - No data to display   Initial Impression / Assessment and Plan / UC Course  I have reviewed the triage vital signs and the nursing notes.  Pertinent labs & imaging results that were available during my care of the patient were reviewed by me and considered in my medical decision making (see chart for details).    Suspect viral gastroenteritis with exanthem, now resolving. Reassurance. Gradually advance to a regular diet.  Avoid milk products for several days.     Final Clinical Impressions(s) / UC Diagnoses   Final diagnoses:  Nausea vomiting and diarrhea  Viral exanthem    ED Discharge Orders    None         Lattie HawBeese, Nuala Chiles A, MD 09/25/17 1844

## 2017-10-16 ENCOUNTER — Other Ambulatory Visit: Payer: Self-pay

## 2017-10-16 ENCOUNTER — Emergency Department (INDEPENDENT_AMBULATORY_CARE_PROVIDER_SITE_OTHER)
Admission: EM | Admit: 2017-10-16 | Discharge: 2017-10-16 | Disposition: A | Discharge status: Home / Self Care | Payer: No Typology Code available for payment source | Source: Home / Self Care

## 2017-10-16 ENCOUNTER — Encounter: Payer: Self-pay | Admitting: *Deleted

## 2017-10-16 DIAGNOSIS — M5432 Sciatica, left side: Secondary | ICD-10-CM | POA: Diagnosis not present

## 2017-10-16 MED ORDER — METHOCARBAMOL 500 MG PO TABS: 500.0000 mg | ORAL_TABLET | Freq: Two times a day (BID) | ORAL | 0 refills | Status: AC

## 2017-10-16 MED ORDER — DICLOFENAC SODIUM 75 MG PO TBEC: 75.0000 mg | DELAYED_RELEASE_TABLET | Freq: Two times a day (BID) | ORAL | 0 refills | Status: AC

## 2017-10-16 MED ORDER — PREDNISONE 50 MG PO TABS: ORAL_TABLET | ORAL | 0 refills | Status: DC

## 2017-10-16 NOTE — Discharge Instructions (Addendum)
Return if any problems.

## 2017-10-16 NOTE — ED Triage Notes (Signed)
Pt c/o LT side LBP that radiates down his LT leg x 10/05/17 after pulling a piece of heavy equipment from his [employer]. He has taken Aleve and applied icy/hot. No OTC meds today.

## 2017-10-20 ENCOUNTER — Other Ambulatory Visit: Payer: Self-pay | Admitting: Gerontology

## 2017-10-20 ENCOUNTER — Ambulatory Visit (INDEPENDENT_AMBULATORY_CARE_PROVIDER_SITE_OTHER): Payer: Self-pay

## 2017-10-20 DIAGNOSIS — M4317 Spondylolisthesis, lumbosacral region: Secondary | ICD-10-CM

## 2017-10-20 DIAGNOSIS — R52 Pain, unspecified: Secondary | ICD-10-CM

## 2017-10-20 NOTE — ED Provider Notes (Signed)
[facility]    CSN: [id number] Arrival date & time: 10/16/17  1637     History   Chief Complaint Chief Complaint  Patient presents with  . Back Pain    HPI Brian Moon is a 37 y.o. male.   The history is provided by the patient. No language interpreter was used.  Back Pain  Location:  Lumbar spine Quality:  Aching Radiates to:  L thigh Pain severity:  Moderate Pain is:  Same all the time Onset quality:  Sudden Duration:  10 days Timing:  Constant Progression:  Worsening Chronicity:  New Context: lifting heavy objects   Relieved by:  Nothing Worsened by:  Nothing Associated symptoms: no abdominal pain and no fever     Past Medical History:  Diagnosis Date  . CMT (cervical motion tenderness)   . Pneumothorax on right     There are no active problems to display for this patient.   Past Surgical History:  Procedure Laterality Date  . Left foot         Home Medications    Prior to Admission medications   Medication Sig Start Date End Date Taking? Authorizing Provider  diclofenac (VOLTAREN) 75 MG EC tablet Take 1 tablet (75 mg total) by mouth 2 (two) times daily. 10/16/17   Elson Areas, PA-C  methocarbamol (ROBAXIN) 500 MG tablet Take 1 tablet (500 mg total) by mouth 2 (two) times daily. 10/16/17   Elson Areas, PA-C  predniSONE (DELTASONE) 50 MG tablet One tablet a day 10/16/17   Elson Areas, PA-C    Family History History reviewed. No pertinent family history.  Social History Social History   Tobacco Use  . Smoking status: Current Every Day Smoker    Packs/day: 1.00    Years: 15.00    Pack years: 15.00    Types: Cigarettes  . Smokeless tobacco: Never Used  Substance Use Topics  . Alcohol use: Yes    Alcohol/week: 0.0 oz    Comment: 3-4 q wk  . Drug use: No     Allergies   Patient has no known allergies.   Review of Systems Review of Systems  Constitutional: Negative for fever.  Gastrointestinal: Negative for  abdominal pain.  Musculoskeletal: Positive for back pain.  All other systems reviewed and are negative.    Physical Exam Triage Vital Signs ED Triage Vitals  Enc Vitals Group     BP 10/16/17 1653 134/87     Pulse Rate 10/16/17 1653 78     Resp 10/16/17 1653 18     Temp 10/16/17 1653 98.5 F (36.9 C)     Temp Source 10/16/17 1653 Oral     SpO2 10/16/17 1653 100 %     Weight 10/16/17 1655 171 lb (77.6 kg)     Height --      Head Circumference --      Peak Flow --      Pain Score 10/16/17 1654 2     Pain Loc --      Pain Edu? --      Excl. in GC? --    No data found.  Updated Vital Signs BP 134/87 (BP Location: Right Arm)   Pulse 78   Temp 98.5 F (36.9 C) (Oral)   Resp 18   Wt 171 lb (77.6 kg)   SpO2 100%   BMI 28.46 kg/m   Visual Acuity Right Eye Distance:   Left Eye Distance:   Bilateral Distance:  Right Eye Near:   Left Eye Near:    Bilateral Near:     Physical Exam  Constitutional: He appears well-developed and well-nourished.  HENT:  Head: Normocephalic and atraumatic.  Eyes: Conjunctivae are normal.  Neck: Neck supple.  Cardiovascular: Normal rate and regular rhythm.  No murmur heard. Pulmonary/Chest: Effort normal and breath sounds normal. No respiratory distress.  Abdominal: Soft. There is no tenderness.  Musculoskeletal: He exhibits no edema.  Tender lumbar spine,  Tender sciatic area left leg, nv and ns intact   Neurological: He is alert.  Skin: Skin is warm and dry.  Psychiatric: He has a normal mood and affect.  Nursing note and vitals reviewed.    UC Treatments / Results  Labs (all labs ordered are listed, but only abnormal results are displayed) Labs Reviewed - No data to display  EKG None Radiology No results found.  Procedures Procedures (including critical care time)  Medications Ordered in UC Medications - No data to display   Initial Impression / Assessment and Plan / UC Course  I have reviewed the triage vital  signs and the nursing notes.  Pertinent labs & imaging results that were available during my care of the patient were reviewed by me and considered in my medical decision making (see chart for details).       Final Clinical Impressions(s) / UC Diagnoses   Final diagnoses:  Sciatica of left side    ED Discharge Orders        Ordered    predniSONE (DELTASONE) 50 MG tablet     10/16/17 1748    methocarbamol (ROBAXIN) 500 MG tablet  2 times daily     10/16/17 1748    diclofenac (VOLTAREN) 75 MG EC tablet  2 times daily     10/16/17 1748     Pt advised followup for recheck.    Controlled Substance Prescriptions [place] Controlled Substance Registry consulted? Not Applicable   Elson AreasSofia, Cera Rorke K, [place]PA-C 10/20/17 0130

## 2018-02-27 ENCOUNTER — Other Ambulatory Visit: Payer: Self-pay

## 2018-02-27 ENCOUNTER — Emergency Department (INDEPENDENT_AMBULATORY_CARE_PROVIDER_SITE_OTHER)
Admission: EM | Admit: 2018-02-27 | Discharge: 2018-02-27 | Disposition: A | Discharge status: Home / Self Care | Payer: PRIVATE HEALTH INSURANCE | Source: Home / Self Care | Attending: Family Medicine | Admitting: Family Medicine

## 2018-02-27 DIAGNOSIS — L247 Irritant contact dermatitis due to plants, except food: Secondary | ICD-10-CM

## 2018-02-27 MED ORDER — PREDNISONE 50 MG PO TABS: 50.0000 mg | ORAL_TABLET | Freq: Every day | ORAL | 0 refills | Status: AC

## 2018-02-27 MED ORDER — METHYLPREDNISOLONE ACETATE 80 MG/ML IJ SUSP
80.0000 mg | Freq: Once | INTRAMUSCULAR | Status: AC
Start: 2018-02-27 — End: 2018-02-27
  Administered 2018-02-27: 80 mg via INTRAMUSCULAR

## 2018-02-27 MED ORDER — TRIAMCINOLONE ACETONIDE 0.1 % EX CREA: 1.0000 "application " | TOPICAL_CREAM | Freq: Two times a day (BID) | CUTANEOUS | 0 refills | Status: AC

## 2018-02-27 NOTE — Discharge Instructions (Signed)
°  You were given a shot of depo-medrol (a steroid) today to help with itching and swelling from a likely allergic reaction.  You have been prescribed 5 days of prednisone, an oral steroid.  You may start this medication tomorrow with breakfast.   ° °

## 2018-02-27 NOTE — ED Triage Notes (Signed)
Rash started last night.  On upper eye lid today

## 2018-02-27 NOTE — ED Provider Notes (Signed)
[facility]    CSN: [id number] Arrival date & time: 02/27/18  1738     History   Chief Complaint Chief Complaint  Patient presents with  . Rash    HPI Brian Moon is a 37 y.o. male.   HPI Brian Moon is a 37 y.o. male presenting to UC with c/o erythematous itchy rash that started last night, gradually worsening and spreading. Rash started on his arms and is now on his face and around his Left eye. He believes it is poison ivy. He tried OTC topical cream on his arms with mild relief but does not want to use the cream on his face. No oral swelling or trouble breathing. Denies new soaps, lotions, or medications.    Past Medical History:  Diagnosis Date  . CMT (cervical motion tenderness)   . Pneumothorax on right     There are no active problems to display for this patient.   Past Surgical History:  Procedure Laterality Date  . Left foot         Home Medications    Prior to Admission medications   Medication Sig Start Date End Date Taking? Authorizing Provider  diclofenac (VOLTAREN) 75 MG EC tablet Take 1 tablet (75 mg total) by mouth 2 (two) times daily. 10/16/17   Elson AreasSofia, Leslie K, PA-C  methocarbamol (ROBAXIN) 500 MG tablet Take 1 tablet (500 mg total) by mouth 2 (two) times daily. 10/16/17   Elson AreasSofia, Leslie K, PA-C  predniSONE (DELTASONE) 50 MG tablet Take 1 tablet (50 mg total) by mouth daily with breakfast for 5 days. 02/27/18 03/04/18  Lurene ShadowPhelps, Lakeishia [place] O, PA-C  triamcinolone cream (KENALOG) 0.1 % Apply 1 application topically 2 (two) times daily. 02/27/18   Lurene ShadowPhelps, Perlita Forbush O, PA-C    Family History History reviewed. No pertinent family history.  Social History Social History   Tobacco Use  . Smoking status: Current Every Day Smoker    Packs/day: 1.00    Years: 15.00    Pack years: 15.00    Types: Cigarettes  . Smokeless tobacco: Never Used  Substance Use Topics  . Alcohol use: Yes    Alcohol/week: 0.0 standard drinks    Comment: 3-4 q wk    . Drug use: No     Allergies   Patient has no known allergies.   Review of Systems Review of Systems  Musculoskeletal: Negative for arthralgias, joint swelling and myalgias.  Skin: Positive for rash. Negative for wound.     Physical Exam Triage Vital Signs ED Triage Vitals [02/27/18 1820]  Enc Vitals Group     BP 119/80     Pulse Rate 79     Resp      Temp 97.8 F (36.6 C)     Temp Source Oral     SpO2 98 %     Weight 179 lb (81.2 kg)     Height 5\' 5"  (1.651 m)     Head Circumference      Peak Flow      Pain Score 0     Pain Loc      Pain Edu?      Excl. in GC?    No data found.  Updated Vital Signs BP 119/80 (BP Location: Right Arm)   Pulse 79   Temp 97.8 F (36.6 C) (Oral)   Ht 5\' 5"  (1.651 m)   Wt 179 lb (81.2 kg)   SpO2 98%   BMI 29.79 kg/m   Visual Acuity  Right Eye Distance:   Left Eye Distance:   Bilateral Distance:    Right Eye Near:   Left Eye Near:    Bilateral Near:     Physical Exam  Constitutional: He is oriented to person, place, and time. He appears well-developed and well-nourished. No distress.  HENT:  Head: Normocephalic and atraumatic.  Mouth/Throat: Oropharynx is clear and moist.  Left side of face: erythema, mild edema around eye and cheek. Non-tender.  Eyes: EOM are normal.  Neck: Normal range of motion.  Cardiovascular: Normal rate.  Pulmonary/Chest: Effort normal.  Musculoskeletal: Normal range of motion.  Neurological: He is alert and oriented to person, place, and time.  Skin: Skin is warm and dry. Rash noted. He is not diaphoretic. There is erythema.  Erythematous maculopapular rash on arms and Left side of face. Lesions on Right arm are dried with scant yellow discharge. Non-tender.   Psychiatric: He has a normal mood and affect. His behavior is normal.  Nursing note and vitals reviewed.    UC Treatments / Results  Labs (all labs ordered are listed, but only abnormal results are displayed) Labs Reviewed - No  data to display  EKG None  Radiology No results found.  Procedures Procedures (including critical care time)  Medications Ordered in UC Medications  methylPREDNISolone acetate (DEPO-MEDROL) injection 80 mg (80 mg Intramuscular Given 02/27/18 1840)    Initial Impression / Assessment and Plan / UC Course  I have reviewed the triage vital signs and the nursing notes.  Pertinent labs & imaging results that were available during my care of the patient were reviewed by me and considered in my medical decision making (see chart for details).     Hx and exam c/w contact dermatitis.  Will tx with steroids.   Final Clinical Impressions(s) / UC Diagnoses   Final diagnoses:  Irritant contact dermatitis due to plants, except food     Discharge Instructions      You were given a shot of depo-medrol (a steroid) today to help with itching and swelling from a likely allergic reaction.  You have been prescribed 5 days of prednisone, an oral steroid.  You may start this medication tomorrow with breakfast.       ED Prescriptions    Medication Sig Dispense Auth. Provider   triamcinolone cream (KENALOG) 0.1 % Apply 1 application topically 2 (two) times daily. 30 g Doroteo GlassmanPhelps, Ciaira Natividad O, PA-C   predniSONE (DELTASONE) 50 MG tablet Take 1 tablet (50 mg total) by mouth daily with breakfast for 5 days. 5 tablet Lurene ShadowPhelps, Yasiel Goyne O, PA-C     Controlled Substance Prescriptions [place] Controlled Substance Registry consulted? Not Applicable   Rolla Platehelps, Tylene Quashie O, PA-C 02/27/18 1931

## 2019-08-05 IMAGING — DX DG LUMBAR SPINE COMPLETE 4+V
5 series · 5 of 5 positions shown · non-contrast
Comparison: None.

CLINICAL DATA: Injured back at work over [DATE]. Was seen and told
sciatica but still having pain. Lt sided low back pain radiating
into lt femur.

EXAM:
LUMBAR SPINE - COMPLETE 4+ VIEW

[l-spine ap]
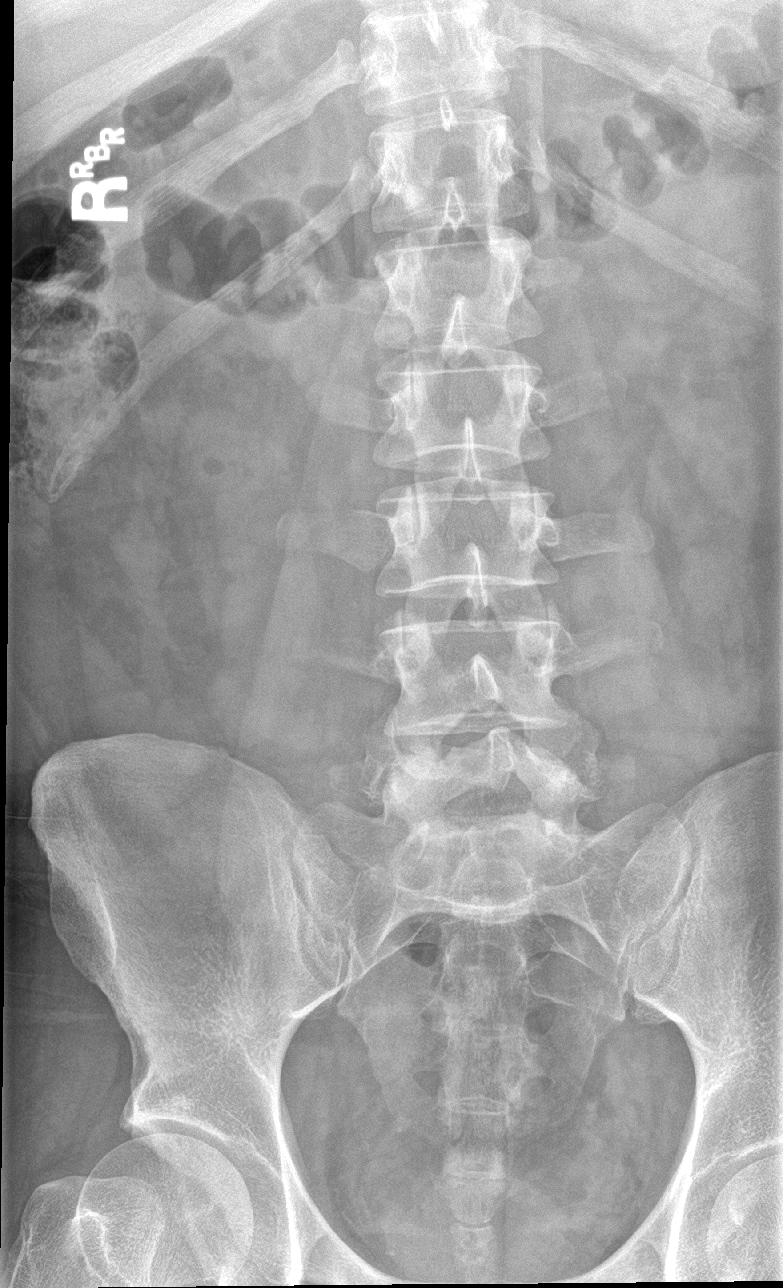

[l-spine obl (1 of 2)]
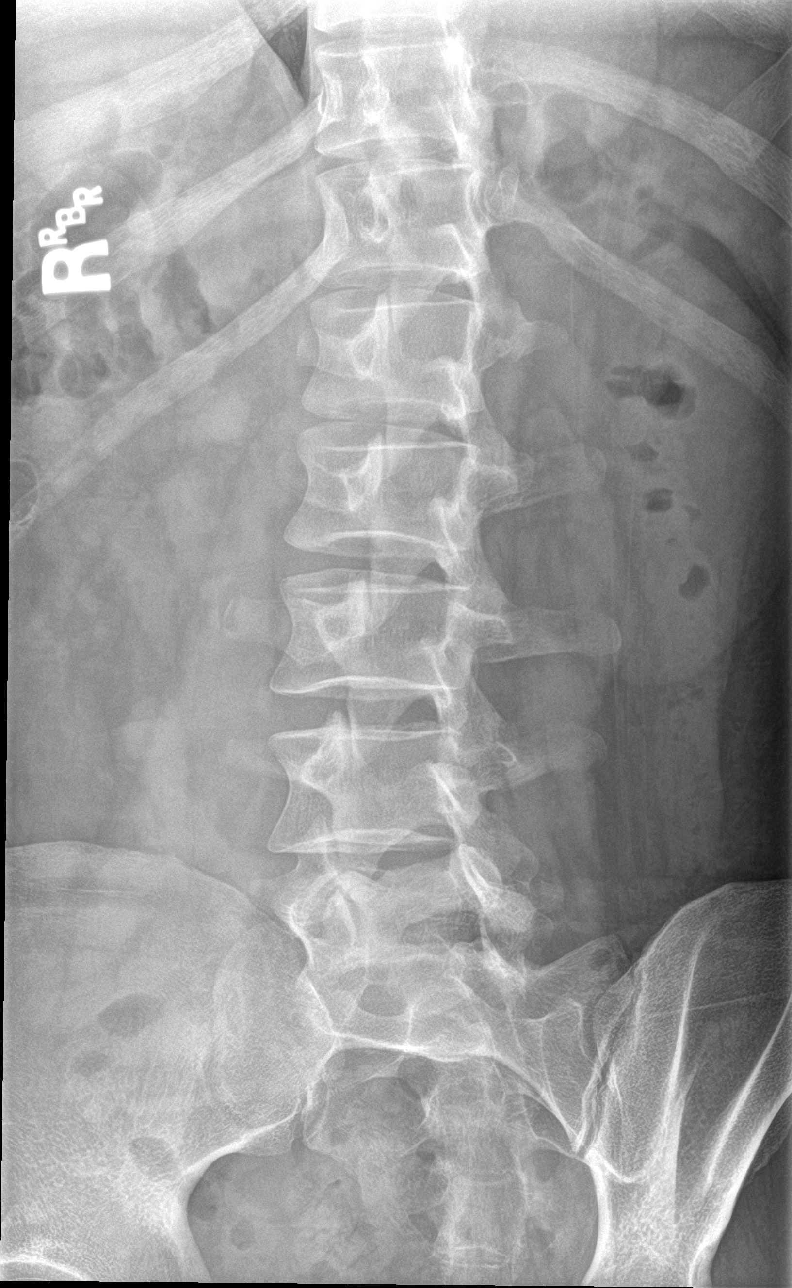

[l-spine obl (2 of 2)]
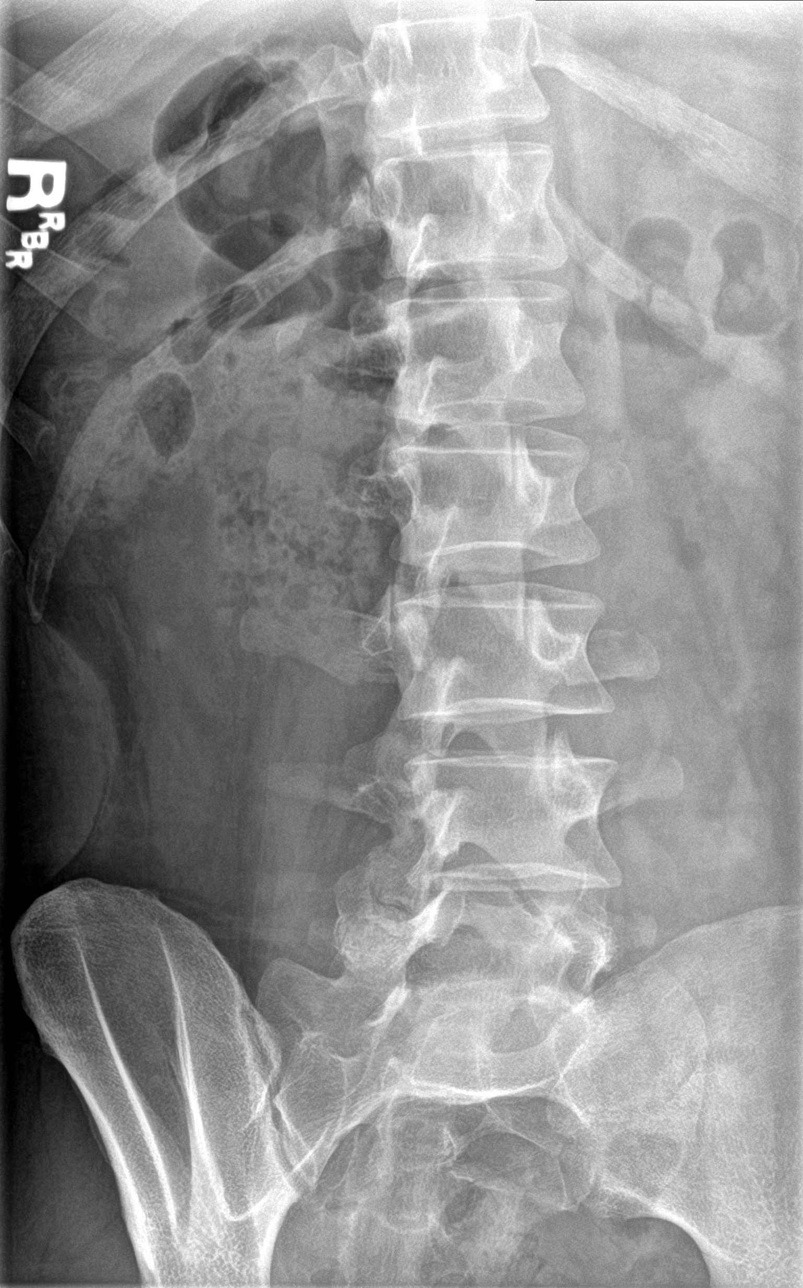

[l-spine lat]
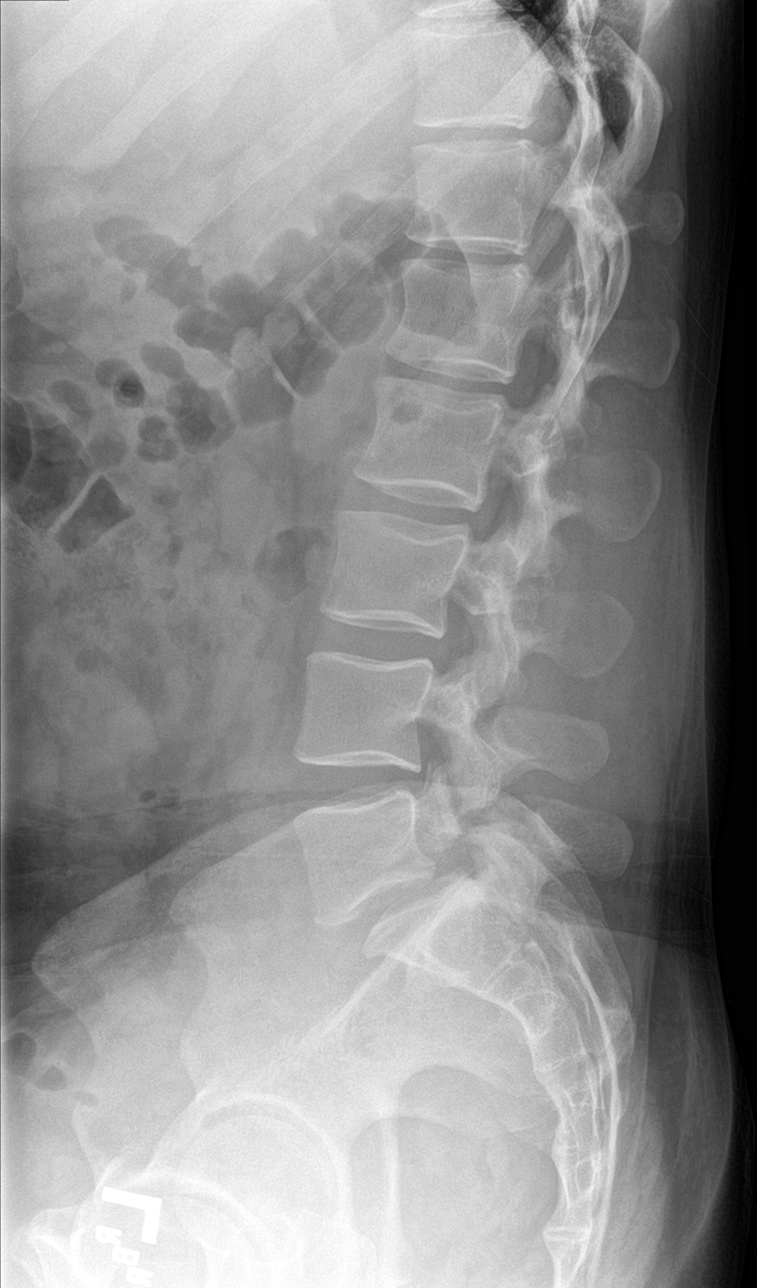

[l-spine spot]
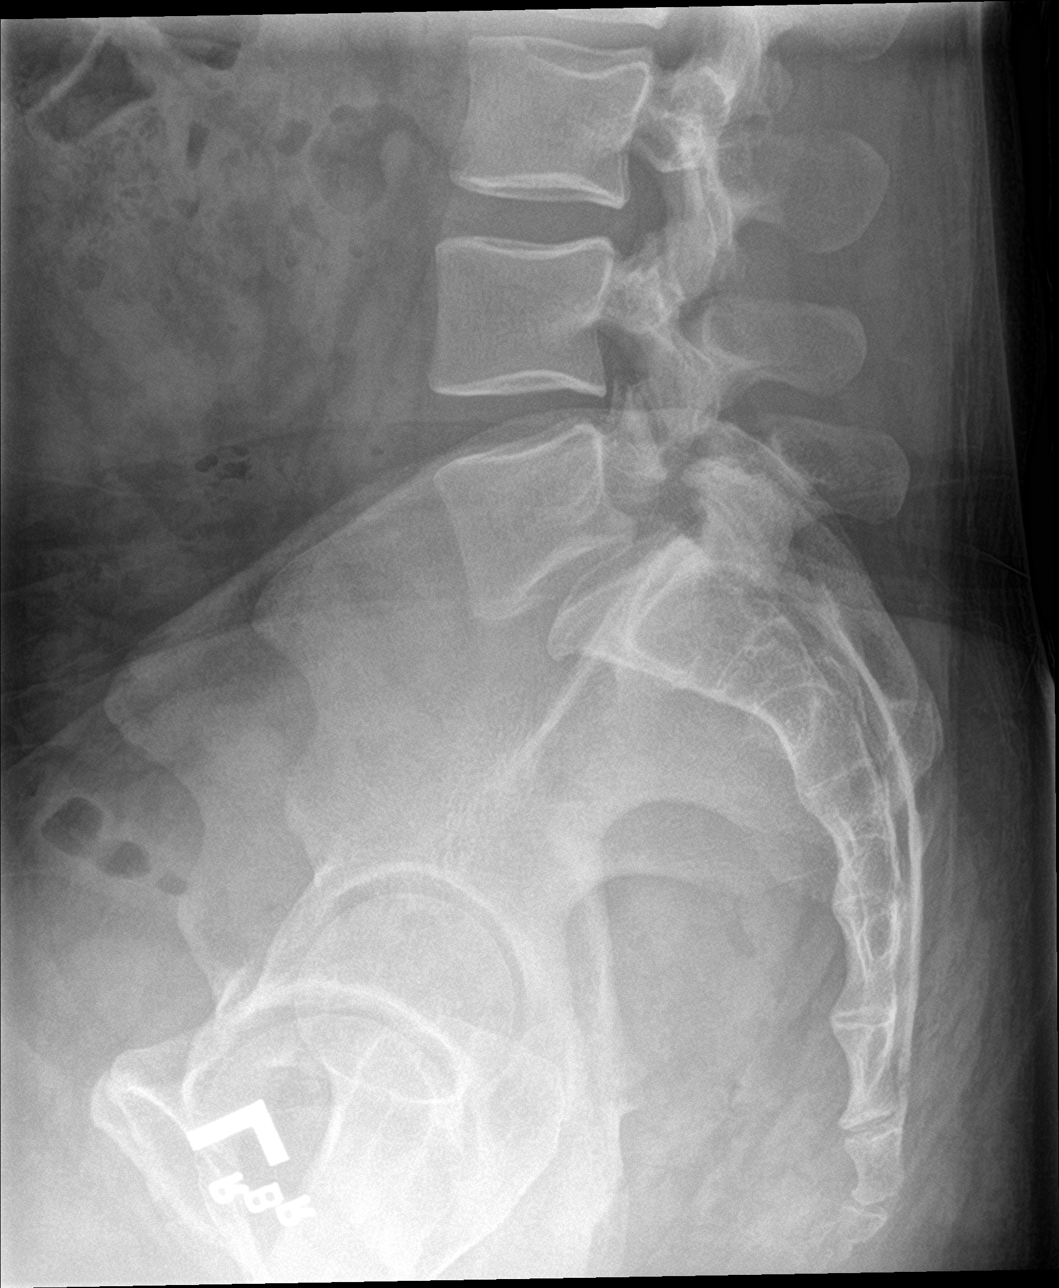

[5 of 5 positions shown; findings below may reference images not displayed]

FINDINGS: There are chronic bilateral pars defects at L5-S1 with a grade 1 to
grade 2 anterolisthesis.

No acute fractures.  No other spondylolisthesis.

Mild loss of disc height at L5-S1. Remaining disc spaces are well
preserved. Facet joints are well preserved.

Soft tissues are unremarkable.
IMPRESSION: 1. Chronic bilateral pars defects at L5-S1 with a grade 1 to grade 2
anterolisthesis. Mild loss of disc height at this level.
